# Patient Record
Sex: Female | Born: 1985 | Hispanic: No | Marital: Married | Smoking: Never smoker
Health system: Southern US, Community
[De-identification: ages and names within clinical notes are randomized; demographics above are authoritative.]

## PROBLEM LIST (undated history)

## (undated) DIAGNOSIS — Z789 Other specified health status: Secondary | ICD-10-CM

## (undated) HISTORY — PX: NO PAST SURGERIES: SHX2092

---

## 2008-08-03 ENCOUNTER — Ambulatory Visit (HOSPITAL_COMMUNITY): Admission: RE | Admit: 2008-08-03 | Discharge: 2008-08-03 | Payer: Self-pay | Admitting: Obstetrics & Gynecology

## 2008-08-12 ENCOUNTER — Ambulatory Visit (HOSPITAL_COMMUNITY): Admission: RE | Admit: 2008-08-12 | Discharge: 2008-08-12 | Payer: Self-pay | Admitting: Obstetrics & Gynecology

## 2008-09-09 ENCOUNTER — Ambulatory Visit (HOSPITAL_COMMUNITY): Admission: RE | Admit: 2008-09-09 | Discharge: 2008-09-09 | Payer: Self-pay | Admitting: Family Medicine

## 2008-12-31 ENCOUNTER — Inpatient Hospital Stay (HOSPITAL_COMMUNITY): Admission: AD | Admit: 2008-12-31 | Discharge: 2008-12-31 | Payer: Self-pay | Admitting: Obstetrics and Gynecology

## 2008-12-31 ENCOUNTER — Ambulatory Visit: Payer: Self-pay | Admitting: Obstetrics and Gynecology

## 2009-01-02 ENCOUNTER — Ambulatory Visit: Payer: Self-pay | Admitting: Obstetrics & Gynecology

## 2009-01-02 ENCOUNTER — Inpatient Hospital Stay (HOSPITAL_COMMUNITY): Admission: AD | Admit: 2009-01-02 | Discharge: 2009-01-04 | Payer: Self-pay | Admitting: Obstetrics & Gynecology

## 2010-04-16 LAB — CBC
HCT: 34.2 % — ABNORMAL LOW (ref 36.0–46.0)
Hemoglobin: 8.6 g/dL — ABNORMAL LOW (ref 12.0–15.0)
MCHC: 31.8 g/dL (ref 30.0–36.0)
MCHC: 32.4 g/dL (ref 30.0–36.0)
MCHC: 32.4 g/dL (ref 30.0–36.0)
MCV: 72.2 fL — ABNORMAL LOW (ref 78.0–100.0)
MCV: 72.3 fL — ABNORMAL LOW (ref 78.0–100.0)
MCV: 72.5 fL — ABNORMAL LOW (ref 78.0–100.0)
Platelets: 145 10*3/uL — ABNORMAL LOW (ref 150–400)
Platelets: 184 10*3/uL (ref 150–400)
RBC: 3.66 MIL/uL — ABNORMAL LOW (ref 3.87–5.11)
RDW: 15 % (ref 11.5–15.5)
RDW: 15.2 % (ref 11.5–15.5)

## 2010-04-16 LAB — CROSSMATCH: ABO/RH(D): B POS

## 2010-04-16 LAB — HEMOGLOBIN AND HEMATOCRIT, BLOOD: HCT: 23.7 % — ABNORMAL LOW (ref 36.0–46.0)

## 2010-04-22 LAB — HEMOGLOBINOPATHY EVALUATION
Hemoglobin Other: 0 % (ref 0.0–0.0)
Hgb A: 97.5 % (ref 96.8–97.8)
Hgb S Quant: 0 % (ref 0.0–0.0)

## 2010-07-08 IMAGING — US US OB FOLLOW-UP
1 series · 14 of 28 positions shown · non-contrast
Comparison: none

OBSTETRICAL ULTRASOUND:
 This ultrasound was performed in The [HOSPITAL], and the [facility] report will be stored to [REDACTED] PACS.

[Series 1: us ob follow-up · 52 acquisitions, 14 frames shown]
[im 2/52]
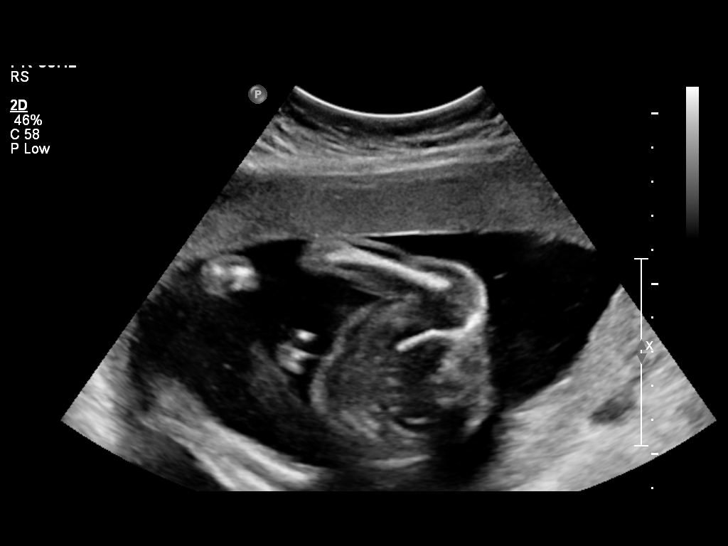
[im 6/52]
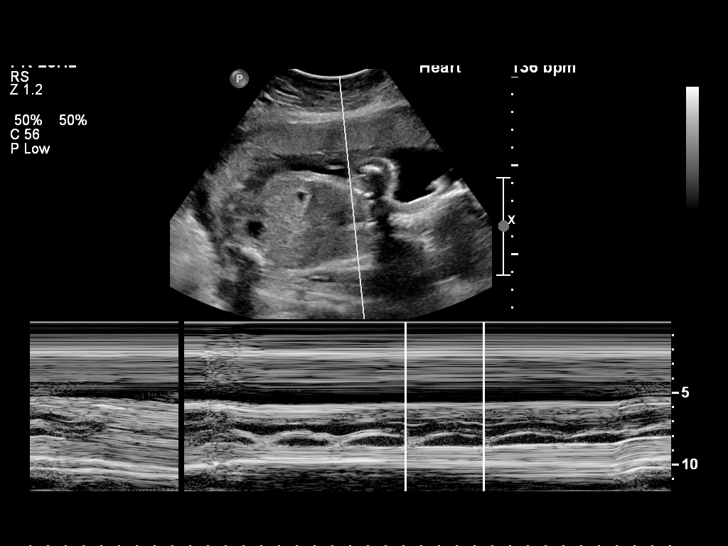
[im 10/52]
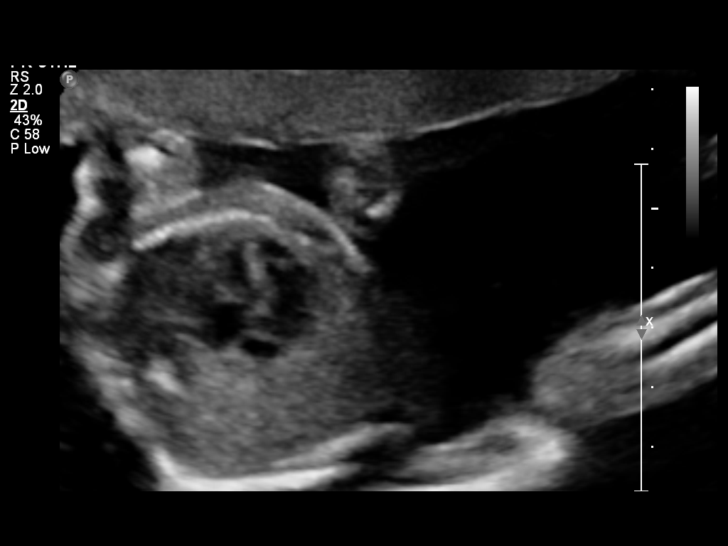
[im 14/52]
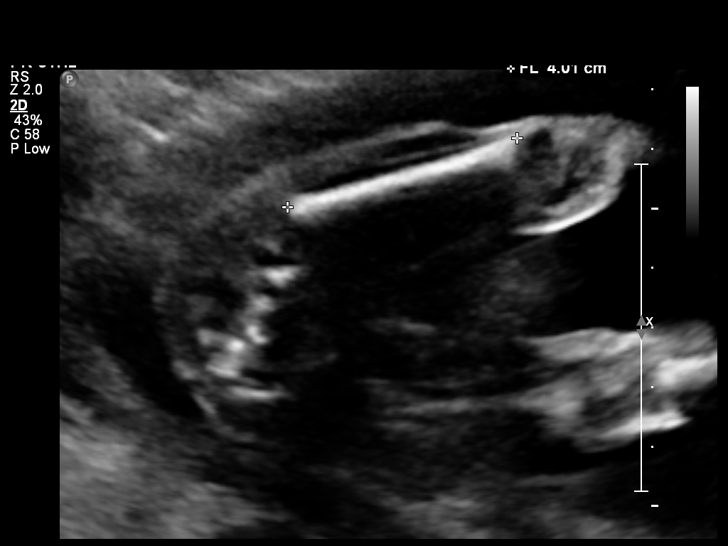
[im 18/52]
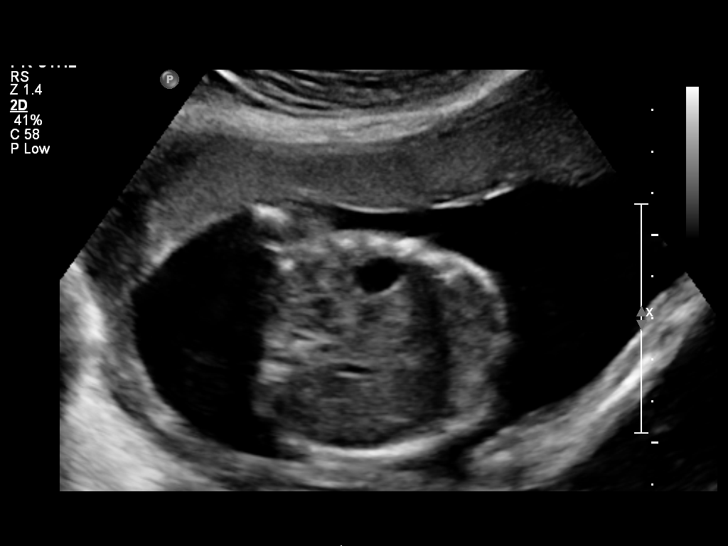
[im 21/52]
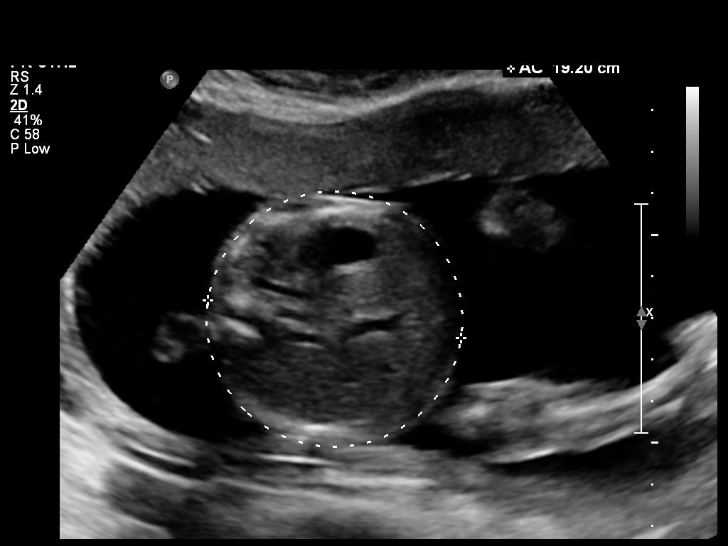
[im 25/52]
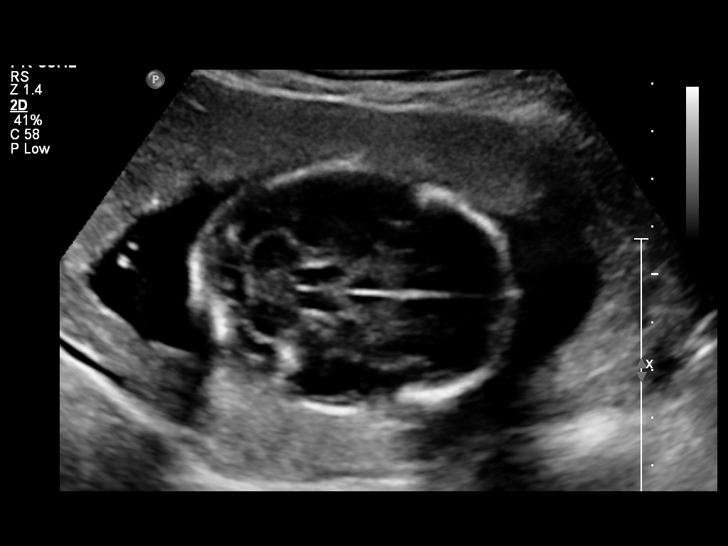
[im 29/52]
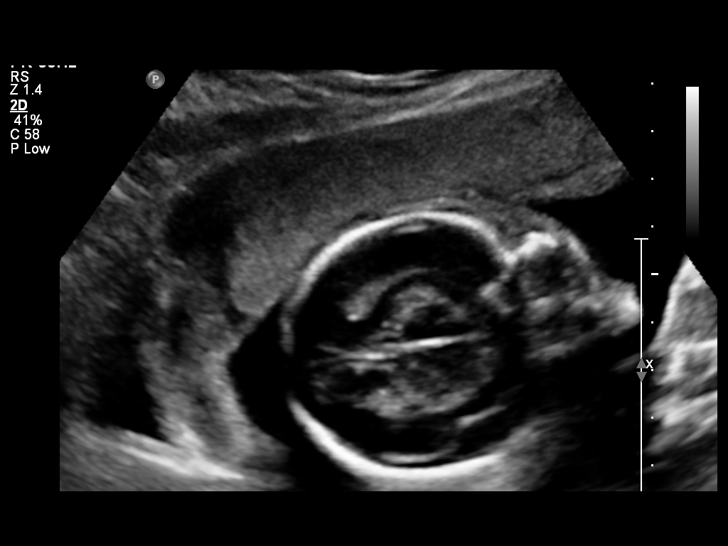
[im 33/52]
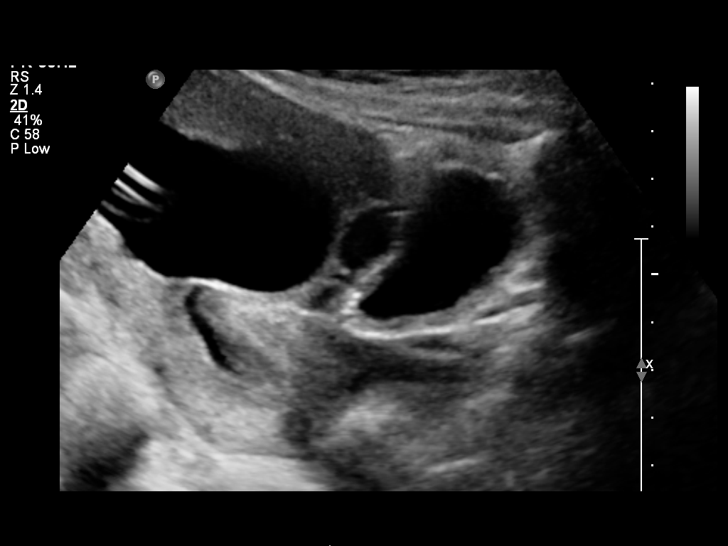
[im 36/52]
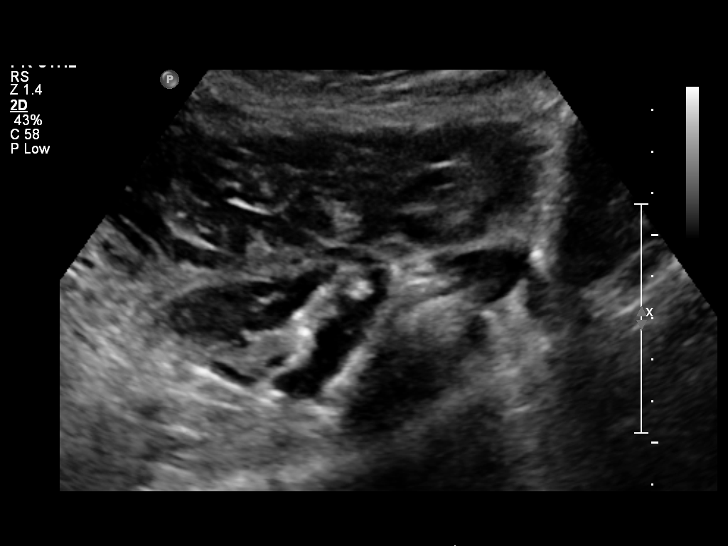
[im 40/52]
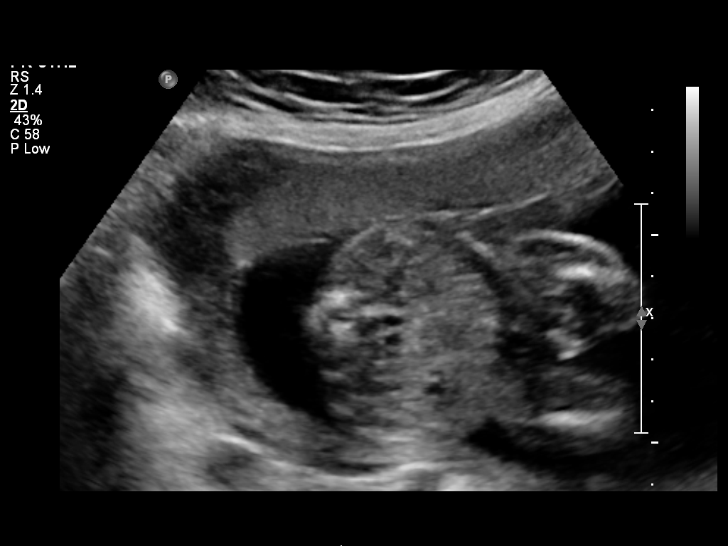
[im 44/52]
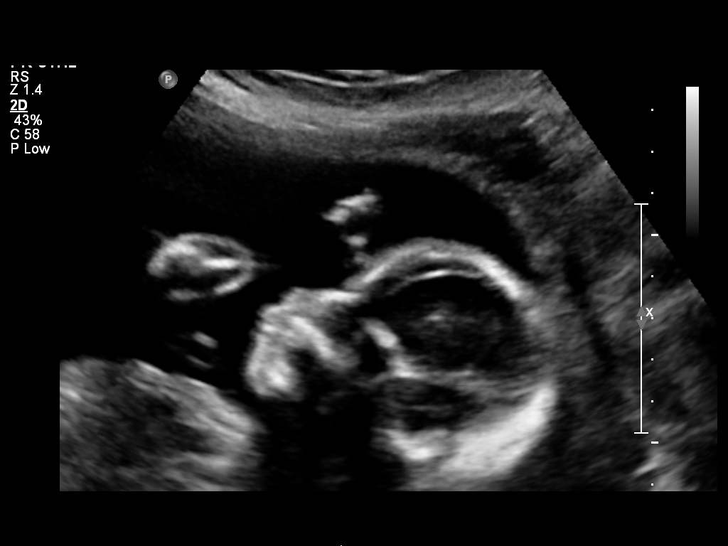
[im 48/52]
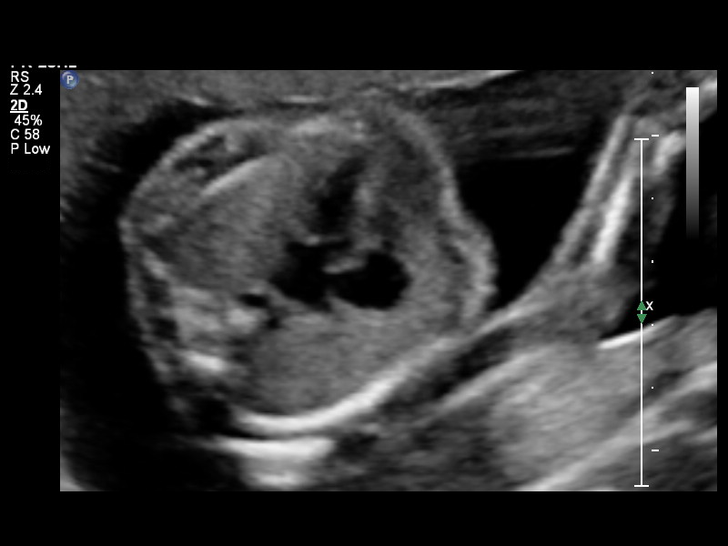
[im 52/52]
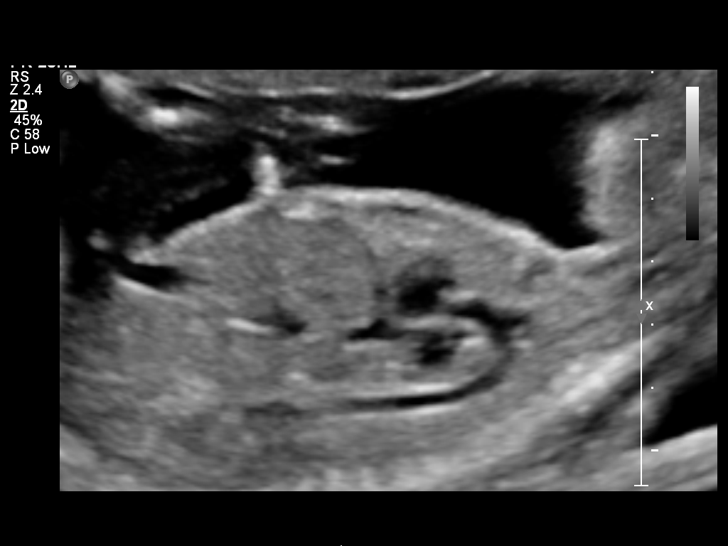

[14 of 28 positions shown; findings below may reference images not displayed]

IMPRESSION: [facility] has also been faxed to the ordering physician.

## 2010-08-07 ENCOUNTER — Other Ambulatory Visit: Payer: Self-pay | Admitting: Family Medicine

## 2010-08-07 DIAGNOSIS — Z3689 Encounter for other specified antenatal screening: Secondary | ICD-10-CM

## 2010-08-07 LAB — HIV ANTIBODY (ROUTINE TESTING W REFLEX): HIV: NONREACTIVE

## 2010-08-07 LAB — GC/CHLAMYDIA PROBE AMP, GENITAL: Gonorrhea: NEGATIVE

## 2010-08-07 LAB — RPR: RPR: NONREACTIVE

## 2010-09-18 ENCOUNTER — Ambulatory Visit (HOSPITAL_COMMUNITY)
Admission: RE | Admit: 2010-09-18 | Discharge: 2010-09-18 | Disposition: A | Discharge status: Home / Self Care | Payer: Medicaid Other | Source: Ambulatory Visit | Attending: Family Medicine | Admitting: Family Medicine

## 2010-09-18 DIAGNOSIS — Z3689 Encounter for other specified antenatal screening: Secondary | ICD-10-CM

## 2010-09-18 DIAGNOSIS — Z1389 Encounter for screening for other disorder: Secondary | ICD-10-CM | POA: Insufficient documentation

## 2010-09-18 DIAGNOSIS — Z363 Encounter for antenatal screening for malformations: Secondary | ICD-10-CM | POA: Insufficient documentation

## 2010-09-18 DIAGNOSIS — O358XX0 Maternal care for other (suspected) fetal abnormality and damage, not applicable or unspecified: Secondary | ICD-10-CM | POA: Insufficient documentation

## 2010-10-17 ENCOUNTER — Encounter (HOSPITAL_COMMUNITY): Payer: Self-pay | Admitting: *Deleted

## 2010-10-17 ENCOUNTER — Other Ambulatory Visit: Payer: Self-pay

## 2010-10-17 ENCOUNTER — Inpatient Hospital Stay (HOSPITAL_COMMUNITY)
Admission: AD | Admit: 2010-10-17 | Discharge: 2010-10-17 | Disposition: A | Discharge status: Home / Self Care | Payer: Medicaid Other | Source: Ambulatory Visit | Attending: Obstetrics & Gynecology | Admitting: Obstetrics & Gynecology

## 2010-10-17 DIAGNOSIS — R51 Headache: Secondary | ICD-10-CM | POA: Insufficient documentation

## 2010-10-17 HISTORY — DX: Other specified health status: Z78.9

## 2010-10-17 NOTE — Progress Notes (Signed)
Pt states, " I have had dizziness for 2 wks, but worse for 2 days."

## 2010-10-17 NOTE — Progress Notes (Signed)
Pt in c/o dizziness x2 weeks, worse x2 days.  Reports fatigue.  Denies any bleeding, discharge, or leaking of fluid.  + FM.  Denies any pain.

## 2010-10-17 NOTE — ED Provider Notes (Signed)
Patient had outpatient EKG for eval for dizziness.  EKG: regular rate of 88, sinus rhythm with narrow qrs complexes with no ST changes.  No arrhythmias present.  Patient informed of results.  Questions answered.   Candelaria Celeste JEHIEL 10/17/2010 1:29 PM

## 2011-01-15 NOTE — L&D Delivery Note (Cosign Needed)
Delivery Note At 3:47 AM a viable and healthy female was delivered via Vaginal, Spontaneous Delivery (Presentation: Right Occiput Anterior).  APGAR: 9, 10; weight .   Placenta status: Intact, Spontaneous.  Cord: 3 vessels with the following complications: None.  Cord pH: not indicated  Anesthesia: None  Episiotomy: none Lacerations: 1st degree Suture Repair: 2.0 vicryl Est. Blood Loss (mL):   Mom to postpartum.  Baby to nursery-stable.   D. Piloto Sherron Flemings Paz. MD PGY-1  02/18/2011, 4:08 AM

## 2011-02-14 ENCOUNTER — Other Ambulatory Visit (HOSPITAL_COMMUNITY): Payer: Self-pay | Admitting: Physician Assistant

## 2011-02-14 DIAGNOSIS — O480 Post-term pregnancy: Secondary | ICD-10-CM

## 2011-02-17 ENCOUNTER — Inpatient Hospital Stay (HOSPITAL_COMMUNITY)
Admission: AD | Admit: 2011-02-17 | Discharge: 2011-02-19 | DRG: 775 | Disposition: A | Discharge status: Home / Self Care | Payer: Medicaid Other | Source: Ambulatory Visit | Attending: Obstetrics & Gynecology | Admitting: Obstetrics & Gynecology

## 2011-02-17 DIAGNOSIS — Z22330 Carrier of Group B streptococcus: Secondary | ICD-10-CM

## 2011-02-17 DIAGNOSIS — IMO0002 Reserved for concepts with insufficient information to code with codable children: Secondary | ICD-10-CM

## 2011-02-17 DIAGNOSIS — O99892 Other specified diseases and conditions complicating childbirth: Secondary | ICD-10-CM | POA: Diagnosis present

## 2011-02-17 NOTE — Progress Notes (Signed)
Pt states her water broke in the [place]-was here for contractions

## 2011-02-18 ENCOUNTER — Encounter (HOSPITAL_COMMUNITY): Payer: Self-pay | Admitting: *Deleted

## 2011-02-18 ENCOUNTER — Ambulatory Visit (HOSPITAL_COMMUNITY): Admission: RE | Admit: 2011-02-18 | Payer: Medicaid Other | Source: Ambulatory Visit

## 2011-02-18 DIAGNOSIS — Z22330 Carrier of Group B streptococcus: Secondary | ICD-10-CM

## 2011-02-18 DIAGNOSIS — O9989 Other specified diseases and conditions complicating pregnancy, childbirth and the puerperium: Secondary | ICD-10-CM

## 2011-02-18 LAB — CBC
HCT: 34.3 % — ABNORMAL LOW (ref 36.0–46.0)
Platelets: 151 10*3/uL (ref 150–400)
RDW: 14.8 % (ref 11.5–15.5)
WBC: 11.1 10*3/uL — ABNORMAL HIGH (ref 4.0–10.5)

## 2011-02-18 MED ORDER — ZOLPIDEM TARTRATE 5 MG PO TABS: 5.0000 mg | ORAL_TABLET | Freq: Every evening | ORAL | Status: DC | PRN

## 2011-02-18 MED ORDER — IBUPROFEN 600 MG PO TABS
600.0000 mg | ORAL_TABLET | Freq: Four times a day (QID) | ORAL | Status: DC | PRN
  Administered 2011-02-18: 600 mg via ORAL
  Filled 2011-02-18: qty 1

## 2011-02-18 MED ORDER — OXYTOCIN 20 UNITS IN LACTATED RINGERS INFUSION - SIMPLE: 125.0000 mL/h | Freq: Once | INTRAVENOUS | Status: DC

## 2011-02-18 MED ORDER — BENZOCAINE-MENTHOL 20-0.5 % EX AERO
INHALATION_SPRAY | CUTANEOUS | Status: AC
  Administered 2011-02-18: 1 via TOPICAL
  Filled 2011-02-18: qty 56

## 2011-02-18 MED ORDER — TETANUS-DIPHTH-ACELL PERTUSSIS 5-2.5-18.5 LF-MCG/0.5 IM SUSP
0.5000 mL | Freq: Once | INTRAMUSCULAR | Status: AC
  Administered 2011-02-19: 0.5 mL via INTRAMUSCULAR
  Filled 2011-02-18: qty 0.5

## 2011-02-18 MED ORDER — LIDOCAINE HCL (PF) 1 % IJ SOLN
30.0000 mL | INTRAMUSCULAR | Status: DC | PRN
  Administered 2011-02-18: 30 mL via SUBCUTANEOUS
  Filled 2011-02-18: qty 30

## 2011-02-18 MED ORDER — SIMETHICONE 80 MG PO CHEW: 80.0000 mg | CHEWABLE_TABLET | ORAL | Status: DC | PRN

## 2011-02-18 MED ORDER — OXYCODONE-ACETAMINOPHEN 5-325 MG PO TABS
1.0000 | ORAL_TABLET | ORAL | Status: DC | PRN
  Administered 2011-02-18 (×3): 1 via ORAL
  Administered 2011-02-18: 2 via ORAL
  Administered 2011-02-19: 1 via ORAL
  Filled 2011-02-18 (×4): qty 1
  Filled 2011-02-18: qty 2

## 2011-02-18 MED ORDER — ONDANSETRON HCL 4 MG/2ML IJ SOLN: 4.0000 mg | Freq: Four times a day (QID) | INTRAMUSCULAR | Status: DC | PRN

## 2011-02-18 MED ORDER — IBUPROFEN 600 MG PO TABS
600.0000 mg | ORAL_TABLET | Freq: Four times a day (QID) | ORAL | Status: DC
  Administered 2011-02-18 – 2011-02-19 (×5): 600 mg via ORAL
  Filled 2011-02-18 (×5): qty 1

## 2011-02-18 MED ORDER — WITCH HAZEL-GLYCERIN EX PADS: 1.0000 "application " | MEDICATED_PAD | CUTANEOUS | Status: DC | PRN

## 2011-02-18 MED ORDER — ACETAMINOPHEN 325 MG PO TABS: 650.0000 mg | ORAL_TABLET | ORAL | Status: DC | PRN

## 2011-02-18 MED ORDER — DIPHENHYDRAMINE HCL 25 MG PO CAPS: 25.0000 mg | ORAL_CAPSULE | Freq: Four times a day (QID) | ORAL | Status: DC | PRN

## 2011-02-18 MED ORDER — OXYCODONE-ACETAMINOPHEN 5-325 MG PO TABS: 1.0000 | ORAL_TABLET | ORAL | Status: DC | PRN

## 2011-02-18 MED ORDER — LANOLIN HYDROUS EX OINT: TOPICAL_OINTMENT | CUTANEOUS | Status: DC | PRN

## 2011-02-18 MED ORDER — SODIUM CHLORIDE 0.9 % IV SOLN
2.0000 g | Freq: Once | INTRAVENOUS | Status: DC
  Filled 2011-02-18: qty 2000

## 2011-02-18 MED ORDER — PRENATAL MULTIVITAMIN CH
1.0000 | ORAL_TABLET | Freq: Every day | ORAL | Status: DC
  Administered 2011-02-18: 1 via ORAL
  Filled 2011-02-18: qty 1

## 2011-02-18 MED ORDER — LACTATED RINGERS IV SOLN: INTRAVENOUS | Status: DC

## 2011-02-18 MED ORDER — DIBUCAINE 1 % RE OINT: 1.0000 "application " | TOPICAL_OINTMENT | RECTAL | Status: DC | PRN

## 2011-02-18 MED ORDER — NALBUPHINE SYRINGE 5 MG/0.5 ML
10.0000 mg | INJECTION | INTRAMUSCULAR | Status: DC | PRN
  Administered 2011-02-18: 10 mg via INTRAVENOUS
  Filled 2011-02-18: qty 0.5
  Filled 2011-02-18: qty 1
  Filled 2011-02-18: qty 0.5

## 2011-02-18 MED ORDER — OXYTOCIN BOLUS FROM INFUSION
500.0000 mL | Freq: Once | INTRAVENOUS | Status: DC
  Filled 2011-02-18: qty 1000
  Filled 2011-02-18: qty 500

## 2011-02-18 MED ORDER — ONDANSETRON HCL 4 MG/2ML IJ SOLN: 4.0000 mg | INTRAMUSCULAR | Status: DC | PRN

## 2011-02-18 MED ORDER — LACTATED RINGERS IV SOLN: 500.0000 mL | INTRAVENOUS | Status: DC | PRN

## 2011-02-18 MED ORDER — CITRIC ACID-SODIUM CITRATE 334-500 MG/5ML PO SOLN: 30.0000 mL | ORAL | Status: DC | PRN

## 2011-02-18 MED ORDER — ONDANSETRON HCL 4 MG PO TABS: 4.0000 mg | ORAL_TABLET | ORAL | Status: DC | PRN

## 2011-02-18 MED ORDER — FLEET ENEMA 7-19 GM/118ML RE ENEM: 1.0000 | ENEMA | RECTAL | Status: DC | PRN

## 2011-02-18 MED ORDER — BENZOCAINE-MENTHOL 20-0.5 % EX AERO
1.0000 "application " | INHALATION_SPRAY | CUTANEOUS | Status: DC | PRN
  Administered 2011-02-18: 1 via TOPICAL

## 2011-02-18 MED ORDER — SENNOSIDES-DOCUSATE SODIUM 8.6-50 MG PO TABS
2.0000 | ORAL_TABLET | Freq: Every day | ORAL | Status: DC
  Administered 2011-02-18: 2 via ORAL

## 2011-02-18 NOTE — Progress Notes (Signed)
UR chart review completed.  

## 2011-02-18 NOTE — H&P (Signed)
Jessica Zamora is a 25 y.o. female G2P1001 with 65.5 w that comes to [facility] presenting whoosh of fluid at 11:00 PM of 02/17/11 and painful contractions. No vaginal bleeding. No cephalea, epigastric pain, change in vision nor edema. Pt speaks arabic an husband  Has been her interpreter. She had [facility] care at [facility] and during this pregnancy has had diagnosis of consanguinity (with genetic counseling offered and negative quad screen) and positive GBS. Plans for contraception: condoms  Maternal Medical History:  Reason for admission: Reason for Admission:   nausea  OB History    Grav Para Term Preterm Abortions TAB SAB Ect Mult Living   2 1 1  0 0 0 0 0 0 1     Past Medical History  Diagnosis Date  . No pertinent past medical history    Past Surgical History  Procedure Date  . No past surgeries    Family History: family history is not on file. Social History:  reports that she has never smoked. She does not have any smokeless tobacco history on file. She reports that she does not drink alcohol or use illicit drugs.  Review of Systems  Constitutional: Negative for fever and chills.  Eyes: Negative for blurred vision.  Respiratory: Negative for cough.   Cardiovascular: Negative for chest pain.  Gastrointestinal: Positive for abdominal pain. Negative for nausea and vomiting.  Genitourinary: Negative.   Musculoskeletal: Negative.   Skin: Negative for rash.  Neurological: Negative.  Negative for headaches.      Blood pressure 131/73, pulse 84, temperature 98.2 F (36.8 C), temperature source Oral, resp. rate 20, height 5\' 5"  (1.651 m), SpO2 99.00%. Exam Physical Exam  Constitutional: She is oriented to person, place, and time. She appears distressed.       Due to painful contractions.  HENT:  Mouth/Throat: Oropharynx is clear and moist.  Eyes: Conjunctivae and EOM are normal.  Neck: Neck supple.  Cardiovascular: Normal rate, regular rhythm and normal heart sounds.   No murmur  heard. Respiratory: Effort normal.  GI: Bowel sounds are normal.       Normal abdominal exam of 40 w gravid pt  Genitourinary:       Thick meconium tinted amniotic fluid per vagina. Cervix 4-5cm / 80%/ -3  Musculoskeletal: She exhibits no edema.  Neurological: She is alert and oriented to person, place, and time. She has normal reflexes.  Skin: Skin is warm and dry.    Prenatal labs: ABO, Rh:  B positive Antibody:  negative Rubella:  and varicella immune RPR:   neg HBsAg:   neg HIV:   neg GBS:   positive 1h GTT 83  Assessment: 26 y/o G2P1001 with 40.5 w admitted for SROM meconium tinted and active phase of labor. 2. Fetal Wellbeing: Category 1  3. Pain Control: declines epidural wants IV pain medication 4. GBS positive  Plan:  1. Admit to [facility] 2. Routine L&D orders 3. Ampicillin per GBS protocol. 3. Analgesia/anesthesia PRN.    D. Piloto Sherron Flemings Paz. MD PGY-1 02/18/2011, 12:06 AM

## 2011-02-19 MED ORDER — IBUPROFEN 600 MG PO TABS: 600.0000 mg | ORAL_TABLET | Freq: Four times a day (QID) | ORAL | Status: AC | PRN

## 2011-02-19 MED ORDER — PRENATAL MULTIVITAMIN CH: 1.0000 | ORAL_TABLET | Freq: Every day | ORAL | Status: DC

## 2011-02-19 NOTE — Progress Notes (Signed)
Post Partum Day 1 Subjective: no complaints, up ad lib, voiding, tolerating PO and + flatus  Objective: Blood pressure 114/70, pulse 69, temperature 98.1 F (36.7 C), temperature source Oral, resp. rate 18, height 5\' 5"  (1.651 m), weight 83 kg (182 lb 15.7 oz), SpO2 100.00%, unknown if currently breastfeeding.  Physical Exam:  General: alert, cooperative and no distress Lochia: appropriate Uterine Fundus: firm DVT Evaluation: No evidence of DVT seen on physical exam. Negative Homan's sign.   Basename 02/17/11 2345  HGB 11.0*  HCT 34.3*    Assessment/Plan: Discharge home, Breastfeeding and Contraception Condom   LOS: 2 days   D. Piloto [employer]. MD PGY-1 02/19/2011, 7:57 AM

## 2011-02-19 NOTE — Discharge Summary (Addendum)
Obstetric Discharge Summary Reason for Admission: onset of labor and rupture of membranes Prenatal Procedures: ultrasound Intrapartum Procedures: spontaneous vaginal delivery and GBS prophylaxis Postpartum Procedures: none Complications-Operative and Postpartum: 1st degree perineal laceration Hemoglobin  Date Value Range Status  02/17/2011 11.0* 12.0-15.0 (g/dL) Final     HCT  Date Value Range Status  02/17/2011 34.3* 36.0-46.0 (%) Final    Discharge Diagnoses: Term Pregnancy-delivered  Discharge Information: Date: 02/19/2011 Activity: unrestricted Diet: routine Medications: Ibuprofen Condition: stable Instructions: refer to practice specific booklet Discharge to: home Follow-up Information    Follow up with [facility]. (make an appointment in 6 weeks)    Contact information:   [address] [place] [address] [phone]         Newborn Data: Live born female  Birth Weight: 8 lb 6.6 oz (3815 g) APGAR: 9, 10  Home with mother.  D. Piloto Jessica Zamora. MD PGY-1 02/19/2011, 11:16 AM

## 2011-02-19 NOTE — Discharge Summary (Signed)
Attestation of Attending Supervision of Resident: Evaluation and management procedures were performed by the [facility] Medicine Resident under my supervision.  I have reviewed the resident's note, chart reviewed and agree with management and plan.  Jaynie Collins, M.D. 02/19/2011 11:29 AM

## 2011-02-19 NOTE — Discharge Summary (Signed)
Attestation of Attending Supervision of Resident: Evaluation and management procedures were performed by the [facility] Medicine Resident under my supervision.  I have reviewed the resident's note, chart reviewed and agree with management and plan.  Jaynie Collins, M.D. 02/19/2011 11:25 AM

## 2011-02-20 NOTE — H&P (Signed)
Agree with above note.  Jessica Zamora H. 02/20/2011 12:09 PM  

## 2011-02-22 ENCOUNTER — Encounter (HOSPITAL_COMMUNITY): Payer: Self-pay | Admitting: *Deleted

## 2011-02-22 ENCOUNTER — Inpatient Hospital Stay (HOSPITAL_COMMUNITY)
Admission: AD | Admit: 2011-02-22 | Discharge: 2011-02-22 | Disposition: A | Discharge status: Home / Self Care | Payer: Medicaid Other | Source: Ambulatory Visit | Attending: Obstetrics and Gynecology | Admitting: Obstetrics and Gynecology

## 2011-02-22 DIAGNOSIS — W010XXA Fall on same level from slipping, tripping and stumbling without subsequent striking against object, initial encounter: Secondary | ICD-10-CM | POA: Insufficient documentation

## 2011-02-22 DIAGNOSIS — IMO0002 Reserved for concepts with insufficient information to code with codable children: Secondary | ICD-10-CM | POA: Insufficient documentation

## 2011-02-22 MED ORDER — OXYCODONE-ACETAMINOPHEN 5-325 MG PO TABS: 1.0000 | ORAL_TABLET | ORAL | Status: AC | PRN

## 2011-02-22 MED ORDER — CYCLOBENZAPRINE HCL 5 MG PO TABS: 5.0000 mg | ORAL_TABLET | Freq: Three times a day (TID) | ORAL | Status: AC | PRN

## 2011-02-22 NOTE — Progress Notes (Addendum)
Fell in the bathroom today.  Did not pass out just slipped. Floor was wet, landed on bottom.  Delivered 02/04- vaginal.  Uncomfortable to sit.( pt leaning to side, off bottom).  Pt is breast feeding and baby is doing well.

## 2011-02-22 NOTE — ED Provider Notes (Signed)
History 26 yo G2P2002 at 4 days post uncomplicated NSVD presents with constant sacral pain onset after she slipped on wet shower tile landing on buttocks at 1030 today.  Walking normally but sitting and moving exacerbate the pain. No radicular pain or paresthesia. Also have pain from stitches (1st degree lac repair) making sitting uncomfortable.  Tried ibuprofen without improvement. Breastfeeding.   Filed Vitals:   02/22/11 1343  BP: 129/77  Pulse: 81  Temp:   Resp: 20     Physical exam Appears anxious and uncomfortable, moving gingerly in bed. Teary when I suggested a digital exam to feel her coccyx (not done) Perineum: normal lochia, midline perineal sutures intact.  Back: No bruising, erythema or skin disruption of back or butttock; tender to palpation over sacrum directly and paraspinous muscles.  Assessment 4 days PP Low back muscle strain due to trauma  Plan D/W Dr. Jolayne Panther. Manage with analgesics, local heat or ice rubs to affected area, rest. Rx Flexeril, Percocet. Return if worse or no improvement with treatment.

## 2011-02-23 ENCOUNTER — Inpatient Hospital Stay (HOSPITAL_COMMUNITY): Admission: RE | Admit: 2011-02-23 | Payer: Medicaid Other | Source: Ambulatory Visit

## 2011-02-23 NOTE — ED Provider Notes (Signed)
Agree with above note.  Jessica Zamora 02/23/2011 6:03 AM

## 2013-11-15 ENCOUNTER — Encounter (HOSPITAL_COMMUNITY): Payer: Self-pay | Admitting: *Deleted

## 2014-02-03 ENCOUNTER — Ambulatory Visit: Payer: Medicaid Other

## 2014-03-26 ENCOUNTER — Encounter (HOSPITAL_COMMUNITY): Payer: Self-pay | Admitting: Emergency Medicine

## 2014-03-26 ENCOUNTER — Emergency Department (HOSPITAL_COMMUNITY)
Admission: EM | Admit: 2014-03-26 | Discharge: 2014-03-26 | Disposition: A | Discharge status: Home / Self Care | Payer: Medicaid Other | Attending: Emergency Medicine | Admitting: Emergency Medicine

## 2014-03-26 ENCOUNTER — Emergency Department (HOSPITAL_COMMUNITY): Payer: Medicaid Other

## 2014-03-26 DIAGNOSIS — R599 Enlarged lymph nodes, unspecified: Secondary | ICD-10-CM | POA: Insufficient documentation

## 2014-03-26 DIAGNOSIS — R112 Nausea with vomiting, unspecified: Secondary | ICD-10-CM

## 2014-03-26 DIAGNOSIS — R1013 Epigastric pain: Secondary | ICD-10-CM | POA: Insufficient documentation

## 2014-03-26 DIAGNOSIS — R079 Chest pain, unspecified: Secondary | ICD-10-CM | POA: Insufficient documentation

## 2014-03-26 DIAGNOSIS — Z3202 Encounter for pregnancy test, result negative: Secondary | ICD-10-CM | POA: Insufficient documentation

## 2014-03-26 LAB — COMPREHENSIVE METABOLIC PANEL
ALBUMIN: 4 g/dL (ref 3.5–5.2)
ALT: 15 U/L (ref 0–35)
ANION GAP: 6 (ref 5–15)
AST: 20 U/L (ref 0–37)
Alkaline Phosphatase: 52 U/L (ref 39–117)
BUN: 6 mg/dL (ref 6–23)
CHLORIDE: 106 mmol/L (ref 96–112)
CO2: 25 mmol/L (ref 19–32)
Calcium: 9.2 mg/dL (ref 8.4–10.5)
Creatinine, Ser: 0.66 mg/dL (ref 0.50–1.10)
GFR calc Af Amer: >90 mL/min (ref >90)
Glucose, Bld: 108 mg/dL — ABNORMAL HIGH (ref 70–99)
Potassium: 4.2 mmol/L (ref 3.5–5.1)
Sodium: 137 mmol/L (ref 135–145)
TOTAL PROTEIN: 7 g/dL (ref 6.0–8.3)
Total Bilirubin: 0.6 mg/dL (ref 0.3–1.2)

## 2014-03-26 LAB — CBC WITH DIFFERENTIAL/PLATELET
BASOS PCT: 0 % (ref 0–1)
Basophils Absolute: 0 10*3/uL (ref 0.0–0.1)
Eosinophils Absolute: 0.2 10*3/uL (ref 0.0–0.7)
Eosinophils Relative: 2 % (ref 0–5)
HEMATOCRIT: 37.5 % (ref 36.0–46.0)
Hemoglobin: 12 g/dL (ref 12.0–15.0)
LYMPHS ABS: 0.7 10*3/uL (ref 0.7–4.0)
LYMPHS PCT: 6 % — AB (ref 12–46)
MCH: 22.1 pg — ABNORMAL LOW (ref 26.0–34.0)
MCHC: 32 g/dL (ref 30.0–36.0)
MCV: 69.2 fL — ABNORMAL LOW (ref 78.0–100.0)
MONO ABS: 0.7 10*3/uL (ref 0.1–1.0)
MONOS PCT: 6 % (ref 3–12)
Neutro Abs: 10.1 10*3/uL — ABNORMAL HIGH (ref 1.7–7.7)
Neutrophils Relative %: 86 % — ABNORMAL HIGH (ref 43–77)
Platelets: 176 10*3/uL (ref 150–400)
RBC: 5.42 MIL/uL — AB (ref 3.87–5.11)
RDW: 14.6 % (ref 11.5–15.5)
SMEAR REVIEW: ADEQUATE
WBC: 11.7 10*3/uL — ABNORMAL HIGH (ref 4.0–10.5)

## 2014-03-26 LAB — URINALYSIS, ROUTINE W REFLEX MICROSCOPIC
BILIRUBIN URINE: NEGATIVE
GLUCOSE, UA: NEGATIVE mg/dL
Hgb urine dipstick: NEGATIVE
Ketones, ur: NEGATIVE mg/dL
Leukocytes, UA: NEGATIVE
NITRITE: NEGATIVE
Protein, ur: NEGATIVE mg/dL
Specific Gravity, Urine: 1.025 (ref 1.005–1.030)
UROBILINOGEN UA: 1 mg/dL (ref 0.0–1.0)
pH: 8 (ref 5.0–8.0)

## 2014-03-26 LAB — I-STAT TROPONIN, ED: TROPONIN I, POC: 0 ng/mL (ref 0.00–0.08)

## 2014-03-26 LAB — LIPASE, BLOOD: LIPASE: 31 U/L (ref 11–59)

## 2014-03-26 LAB — POC URINE PREG, ED: Preg Test, Ur: NEGATIVE

## 2014-03-26 MED ORDER — RANITIDINE HCL 150 MG PO CAPS: 150.0000 mg | ORAL_CAPSULE | Freq: Every day | ORAL | Status: DC

## 2014-03-26 MED ORDER — ONDANSETRON HCL 4 MG PO TABS
4.0000 mg | ORAL_TABLET | Freq: Once | ORAL | Status: AC
Start: 2014-03-26 — End: 2014-03-26
  Administered 2014-03-26: 4 mg via ORAL
  Filled 2014-03-26: qty 1

## 2014-03-26 MED ORDER — ACETAMINOPHEN 325 MG PO TABS
650.0000 mg | ORAL_TABLET | Freq: Once | ORAL | Status: AC
  Administered 2014-03-26: 650 mg via ORAL
  Filled 2014-03-26: qty 2

## 2014-03-26 NOTE — ED Provider Notes (Signed)
29 year old female presents with epigastric discomfort, worse at night, cough which is worse at night, no exertional symptoms. On exam the patient states she has no pain, she has clear heart and lung sounds, occasional dry cough, no peripheral edema. EKG unremarkable, patient doubtful to have acute coronary syndrome or other severe or pathologic cause of her pain, more likely to be acid reflux, possibly related to upper respiratory infection, patient can be safely discharged home to follow-up in the outpatient setting.   EKG Interpretation  Date/Time:  Saturday March 26 2014 04:05:15 EST Ventricular Rate:  90 PR Interval:  146 QRS Duration: 76 QT Interval:  354 QTC Calculation: 433 R Axis:   54 Text Interpretation:  Normal sinus rhythm Normal ECG since last tracing no significant change Confirmed by Oni Dietzman  MD, Nikaela Coyne ([id number]) on 03/26/2014 7:12:05 AM       Medical screening examination/treatment/procedure(s) were conducted as a shared visit with non-physician practitioner(s) and myself.  I personally evaluated the patient during the encounter.  Clinical Impression:   Final diagnoses:  Epigastric abdominal pain  Nausea and vomiting, vomiting of unspecified type  Chest pain, unspecified chest pain type         Eber HongBrian Njeri Vicente, MD 03/26/14 1552

## 2014-03-26 NOTE — ED Notes (Signed)
PO Challenge started

## 2014-03-26 NOTE — Discharge Instructions (Signed)
Return to the emergency room with worsening of symptoms, new symptoms or with symptoms that are concerning , especially chest pain that feels like a pressure, spreads to left arm or jaw, worse with exertion, associated with nausea, vomiting, shortness of breath and/or sweating.  Start taking zantac daily. Follow up with wellness center in 2 days. They have walk in clinic. Drink plenty of fluids with electrolytes especially Gatorade. OTC cold medications such as mucinex, nyquil, dayquil are recommended. Chloraseptic for sore throat. Read below information and follow recommendations.  Chest Pain (Nonspecific) It is often hard to give a specific diagnosis for the cause of chest pain. There is always a chance that your pain could be related to something serious, such as a heart attack or a blood clot in the lungs. You need to follow up with your health care provider for further evaluation. CAUSES   Heartburn.  Pneumonia or bronchitis.  Anxiety or stress.  Inflammation around your heart (pericarditis) or lung (pleuritis or pleurisy).  A blood clot in the lung.  A collapsed lung (pneumothorax). It can develop suddenly on its own (spontaneous pneumothorax) or from trauma to the chest.  Shingles infection (herpes zoster virus). The chest wall is composed of bones, muscles, and cartilage. Any of these can be the source of the pain.  The bones can be bruised by injury.  The muscles or cartilage can be strained by coughing or overwork.  The cartilage can be affected by inflammation and become sore (costochondritis). DIAGNOSIS  Lab tests or other studies may be needed to find the cause of your pain. Your health care provider may have you take a test called an ambulatory electrocardiogram (ECG). An ECG records your heartbeat patterns over a 24-hour period. You may also have other tests, such as:  Transthoracic echocardiogram (TTE). During echocardiography, sound waves are used to evaluate how  blood flows through your heart.  Transesophageal echocardiogram (TEE).  Cardiac monitoring. This allows your health care provider to monitor your heart rate and rhythm in real time.  Holter monitor. This is a portable device that records your heartbeat and can help diagnose heart arrhythmias. It allows your health care provider to track your heart activity for several days, if needed.  Stress tests by exercise or by giving medicine that makes the heart beat faster. TREATMENT   Treatment depends on what may be causing your chest pain. Treatment may include:  Acid blockers for heartburn.  Anti-inflammatory medicine.  Pain medicine for inflammatory conditions.  Antibiotics if an infection is present.  You may be advised to change lifestyle habits. This includes stopping smoking and avoiding alcohol, caffeine, and chocolate.  You may be advised to keep your head raised (elevated) when sleeping. This reduces the chance of acid going backward from your stomach into your esophagus. Most of the time, nonspecific chest pain will improve within 2-3 days with rest and mild pain medicine.  HOME CARE INSTRUCTIONS   If antibiotics were prescribed, take them as directed. Finish them even if you start to feel better.  For the next few days, avoid physical activities that bring on chest pain. Continue physical activities as directed.  Do not use any tobacco products, including cigarettes, chewing tobacco, or electronic cigarettes.  Avoid drinking alcohol.  Only take medicine as directed by your health care provider.  Follow your health care provider's suggestions for further testing if your chest pain does not go away.  Keep any follow-up appointments you made. If you do not go to  an appointment, you could develop lasting (chronic) problems with pain. If there is any problem keeping an appointment, call to reschedule. SEEK MEDICAL CARE IF:   Your chest pain does not go away, even after  treatment.  You have a rash with blisters on your chest.  You have a fever. SEEK IMMEDIATE MEDICAL CARE IF:   You have increased chest pain or pain that spreads to your arm, neck, jaw, back, or abdomen.  You have shortness of breath.  You have an increasing cough, or you cough up blood.  You have severe back or abdominal pain.  You feel nauseous or vomit.  You have severe weakness.  You faint.  You have chills. This is an emergency. Do not wait to see if the pain will go away. Get medical help at once. Call your local emergency services (911 in [place] Do not drive yourself to the hospital. MAKE SURE YOU:   Understand these instructions.  Will watch your condition.  Will get help right away if you are not doing well or get worse. Document Released: 10/10/2004 Document Revised: 01/05/2013 Document Reviewed: 08/06/2007 [facility] Patient Information 2015 [place], [place]. This information is not intended to replace advice given to you by your health care provider. Make sure you discuss any questions you have with your health care provider.   Abdominal Pain Many things can cause abdominal pain. Usually, abdominal pain is not caused by a disease and will improve without treatment. It can often be observed and treated at home. Your health care provider will do a physical exam and possibly order blood tests and X-rays to help determine the seriousness of your pain. However, in many cases, more time must pass before a clear cause of the pain can be found. Before that point, your health care provider may not know if you need more testing or further treatment. HOME CARE INSTRUCTIONS  Monitor your abdominal pain for any changes. The following actions may help to alleviate any discomfort you are experiencing:  Only take over-the-counter or prescription medicines as directed by your health care provider.  Do not take laxatives unless directed to do so by your health care provider.  Try a  clear liquid diet (broth, tea, or water) as directed by your health care provider. Slowly move to a bland diet as tolerated. SEEK MEDICAL CARE IF:  You have unexplained abdominal pain.  You have abdominal pain associated with nausea or diarrhea.  You have pain when you urinate or have a bowel movement.  You experience abdominal pain that wakes you in the night.  You have abdominal pain that is worsened or improved by eating food.  You have abdominal pain that is worsened with eating fatty foods.  You have a fever. SEEK IMMEDIATE MEDICAL CARE IF:   Your pain does not go away within 2 hours.  You keep throwing up (vomiting).  Your pain is felt only in portions of the abdomen, such as the right side or the left lower portion of the abdomen.  You pass bloody or black tarry stools. MAKE SURE YOU:  Understand these instructions.   Will watch your condition.   Will get help right away if you are not doing well or get worse.  Document Released: 10/10/2004 Document Revised: 01/05/2013 Document Reviewed: 09/09/2012 [facility] Patient Information 2015 [place], [place]. This information is not intended to replace advice given to you by your health care provider. Make sure you discuss any questions you have with your health care provider.

## 2014-03-26 NOTE — ED Notes (Signed)
C/o chest pain, upper abd pain, nausea, and vomiting since 8pm.  Denies urinary complaint.

## 2014-03-26 NOTE — ED Provider Notes (Signed)
CSN: [id number]     Arrival date & time 03/26/14  0343 History   First MD Initiated Contact with Patient 03/26/14 0606     Chief Complaint  Patient presents with  . Abdominal Pain  . Chest Pain     (Consider location/radiation/quality/duration/timing/severity/associated sxs/prior Treatment) HPI  Jessica Zamora is a 29 y.o. female presenting with epigastric abdominal pain as well as chest pain that started last night around 8 PM. She also reports nausea and vomiting times one that is nonbloody nonbilious. No shortness of breath. No cardiac history. She denies any alleviating or aggravating factors and also reports some cough congestion and runny nose. She denies any changes in her stool no melanotic or hematochezia. She denies fevers or chills. Patient states she has had no pain at this time. She does report some nausea. Pt denies history of DVT, PE, recent surgery or trauma, malignancy, hemoptysis, exogenous estrogen use, unilateral leg swelling or tenderness, immobilization.   Past Medical History  Diagnosis Date  . No pertinent past medical history    Past Surgical History  Procedure Laterality Date  . No past surgeries     Family History  Problem Relation Age of Onset  . Anesthesia problems Neg Hx    History  Substance Use Topics  . Smoking status: Never Smoker   . Smokeless tobacco: Never Used  . Alcohol Use: No   OB History    Gravida Para Term Preterm AB TAB SAB Ectopic Multiple Living   0 0 0 0 0 0 2     Review of Systems 10 Systems reviewed and are negative for acute change except as noted in the HPI.    Allergies  Review of patient's allergies indicates no known allergies.  Home Medications   Prior to Admission medications   Medication Sig Start Date End Date Taking? Authorizing Provider  Multiple Vitamin (MULTIVITAMIN WITH MINERALS) TABS tablet Take 1 tablet by mouth daily.   Yes Historical Provider, MD  Prenatal Vit-Fe Fumarate-FA (PRENATAL  MULTIVITAMIN) TABS Take 1 tablet by mouth daily. Patient not taking: Reported on 03/26/2014 02/19/11   Dayarmys Piloto de Criselda Peaches, MD  ranitidine (ZANTAC) 150 MG capsule Take 1 capsule (150 mg total) by mouth daily. 03/26/14   Jessica Conroy, PA-C   BP 99/67 mmHg  Pulse 68  Temp(Src) 97.9 F (36.6 C) (Oral)  Resp 20  Wt 163 lb 2 oz (73.993 kg)  SpO2 100%  LMP 03/13/2014 Physical Exam  Constitutional: She appears well-developed and well-nourished. No distress.  HENT:  Head: Normocephalic and atraumatic.  Mouth/Throat: Mucous membranes are normal. Posterior oropharyngeal erythema present. No oropharyngeal exudate or posterior oropharyngeal edema.  Eyes: Conjunctivae and EOM are normal. Right eye exhibits no discharge. Left eye exhibits no discharge.  Neck: Normal range of motion. Neck supple. No JVD present.  Cardiovascular: Normal rate, regular rhythm and normal heart sounds.   No leg swelling or tenderness. Negative Homan's sign.  Pulmonary/Chest: Effort normal and breath sounds normal. No respiratory distress. She has no wheezes. She has no rales.  Abdominal: Soft. Bowel sounds are normal. She exhibits no distension. There is no tenderness.  Lymphadenopathy:    She has cervical adenopathy.  Neurological: She is alert.  Skin: Skin is warm and dry. She is not diaphoretic.  Nursing note and vitals reviewed.   ED Course  Procedures (including critical care time) Labs Review Labs Reviewed  CBC WITH DIFFERENTIAL/PLATELET - Abnormal; Notable for the following:    WBC 11.7 (*)  RBC 5.42 (*)    MCV 69.2 (*)    MCH 22.1 (*)    Neutrophils Relative % 86 (*)    Lymphocytes Relative 6 (*)    Neutro Abs 10.1 (*)    All other components within normal limits  COMPREHENSIVE METABOLIC PANEL - Abnormal; Notable for the following:    Glucose, Bld 108 (*)    All other components within normal limits  LIPASE, BLOOD  URINALYSIS, ROUTINE W REFLEX MICROSCOPIC  I-STAT TROPOININ, ED  POC URINE  PREG, ED    Imaging Review Dg Chest 2 View  03/26/2014   CLINICAL DATA:  Chest and abdominal pain. Emesis. Symptoms for 2 months after eating. Productive cough.  EXAM: CHEST  2 VIEW  COMPARISON:  None.  FINDINGS: The cardiomediastinal contours are normal. The lungs are clear. Pulmonary vasculature is normal. No consolidation, pleural effusion, or pneumothorax. No acute osseous abnormalities are seen.  IMPRESSION: No acute pulmonary process.   Electronically Signed   By: Rubye OaksMelanie  Ehinger M.D.   On: 03/26/2014 05:17     EKG Interpretation   Date/Time:  Saturday March 26 2014 04:05:15 EST Ventricular Rate:  90 PR Interval:  146 QRS Duration: 76 QT Interval:  354 QTC Calculation: 433 R Axis:   54 Text Interpretation:  Normal sinus rhythm Normal ECG since last tracing no  significant change Confirmed by MILLER  MD, BRIAN ([id number]) on 03/26/2014  7:12:05 AM      MDM   Final diagnoses:  Epigastric abdominal pain  Nausea and vomiting, vomiting of unspecified type  Chest pain, unspecified chest pain type   Patient presenting with 2 day history of constant chest pain and epigastric abdominal pain with nausea and vomiting. PERC negative with a risk heart score. Patient also with cough and runny nose. VSS. No tenderness on exam. No JVD or peripheral edema. Patient stating she is in no pain at this time. EKG without evidence of ischemia. Lab work noncontributory. Patient's chest pain likely related to viral syndrome versus GERD.  I doubt ACS, PE or aortic dissection. Patient's tolerating fluid in in the ED without difficulty. Patient nontoxic and stable for discharge and outpatient management. Patient without PCP and given referral to the wellness center and instructions to follow-up in 2-5 days.  Discussed return precautions with patient. Discussed all results and patient verbalizes understanding and agrees with plan.  This is a shared patient. This patient was discussed with the physician who  saw and evaluated the patient and agrees with the plan.   Jessica ConroyVictoria Maryiah Olvey, PA-C 03/26/14 1538  Eber HongBrian Miller, MD 03/26/14 [phone]

## 2015-03-09 ENCOUNTER — Encounter: Payer: Self-pay | Admitting: Family Medicine

## 2015-03-09 ENCOUNTER — Ambulatory Visit: Payer: Medicaid Other | Attending: Family Medicine | Admitting: Family Medicine

## 2015-03-09 VITALS — BP 101/65 | HR 75 | Temp 98.2°F | Resp 16 | Ht 63.75 in | Wt 176.0 lb

## 2015-03-09 DIAGNOSIS — D509 Iron deficiency anemia, unspecified: Secondary | ICD-10-CM | POA: Diagnosis not present

## 2015-03-09 DIAGNOSIS — L659 Nonscarring hair loss, unspecified: Secondary | ICD-10-CM | POA: Insufficient documentation

## 2015-03-09 DIAGNOSIS — Z0000 Encounter for general adult medical examination without abnormal findings: Secondary | ICD-10-CM | POA: Diagnosis not present

## 2015-03-09 DIAGNOSIS — Z0001 Encounter for general adult medical examination with abnormal findings: Secondary | ICD-10-CM | POA: Insufficient documentation

## 2015-03-09 DIAGNOSIS — Z114 Encounter for screening for human immunodeficiency virus [HIV]: Secondary | ICD-10-CM

## 2015-03-09 DIAGNOSIS — E559 Vitamin D deficiency, unspecified: Secondary | ICD-10-CM

## 2015-03-09 DIAGNOSIS — Z79899 Other long term (current) drug therapy: Secondary | ICD-10-CM | POA: Diagnosis not present

## 2015-03-09 LAB — CBC
HCT: 36.4 % (ref 36.0–46.0)
Hemoglobin: 11.6 g/dL — ABNORMAL LOW (ref 12.0–15.0)
MCH: 22.1 pg — ABNORMAL LOW (ref 26.0–34.0)
MCHC: 31.9 g/dL (ref 30.0–36.0)
MCV: 69.5 fL — AB (ref 78.0–100.0)
PLATELETS: 226 10*3/uL (ref 150–400)
RBC: 5.24 MIL/uL — ABNORMAL HIGH (ref 3.87–5.11)
RDW: 14.2 % (ref 11.5–15.5)
WBC: 9.3 10*3/uL (ref 4.0–10.5)

## 2015-03-09 LAB — IRON AND TIBC
%SAT: 18 % (ref 11–50)
IRON: 60 ug/dL (ref 40–190)
TIBC: 337 ug/dL (ref 250–450)
UIBC: 277 ug/dL (ref 125–400)

## 2015-03-09 LAB — TSH: TSH: 1.17 mIU/L

## 2015-03-09 LAB — FERRITIN: Ferritin: 25 ng/mL (ref 10–154)

## 2015-03-09 LAB — POCT GLYCOSYLATED HEMOGLOBIN (HGB A1C): HEMOGLOBIN A1C: 5.3

## 2015-03-09 LAB — HIV ANTIBODY (ROUTINE TESTING W REFLEX): HIV 1&2 Ab, 4th Generation: NONREACTIVE

## 2015-03-09 NOTE — Progress Notes (Signed)
Establish care  Hair loss, bold spot  No pain today  No tobacco user  No suicidal thoughts in the past two weeks

## 2015-03-09 NOTE — Patient Instructions (Addendum)
Jessica Zamora was seen today for hair/scalp problem and establish care.  Diagnoses and all orders for this visit:  Healthcare maintenance -     POCT glycosylated hemoglobin (Hb A1C)  Thinning hair -     TSH -     CBC -     Vitamin D, 25-hydroxy -     Iron and TIBC -     Ferritin  Screening for HIV (human immunodeficiency virus) -     HIV antibody (with reflex)   Take at least 1000 mcg of biotin or take the hair, skin and nail vitamin.   You will be called with lab results  F/u for pap smear in 4-6 weeks  Dr. Armen Pickup

## 2015-03-09 NOTE — Assessment & Plan Note (Signed)
A: thinning hair at temples P: Take at least 1000 mcg of biotin or take the hair, skin and nail vitamin.

## 2015-03-09 NOTE — Progress Notes (Signed)
   Subjective:  Patient ID: Jessica Zamora, female    DOB: [birth year]  Age: 30 y.o. MRN: [id number]  CC: Hair/Scalp Problem and Establish Care   HPI Devonia Farro presents for    1. hair loss: x 4 years at temples. Painless. R >L. Tried biotin and minoxidil without improvement. Distressed by hair loss. Hair loss started while breastfeeding.   Social History  Substance Use Topics  . Smoking status: Never Smoker   . Smokeless tobacco: Never Used  . Alcohol Use: No   Outpatient Prescriptions Prior to Visit  Medication Sig Dispense Refill  . Multiple Vitamin (MULTIVITAMIN WITH MINERALS) TABS tablet Take 1 tablet by mouth daily. Reported on 03/09/2015    . Prenatal Vit-Fe Fumarate-FA (PRENATAL MULTIVITAMIN) TABS Take 1 tablet by mouth daily. (Patient not taking: Reported on 03/26/2014) 60 tablet 0  . ranitidine (ZANTAC) 150 MG capsule Take 1 capsule (150 mg total) by mouth daily. (Patient not taking: Reported on 03/09/2015) 30 capsule 0   No facility-administered medications prior to visit.    ROS Review of Systems  Constitutional: Negative for fever and chills.  Eyes: Negative for visual disturbance.  Respiratory: Negative for shortness of breath.   Cardiovascular: Negative for chest pain.  Gastrointestinal: Negative for abdominal pain and blood in stool.  Musculoskeletal: Negative for back pain and arthralgias.  Skin: Negative for rash.  Allergic/Immunologic: Negative for immunocompromised state.  Hematological: Negative for adenopathy. Does not bruise/bleed easily.  Psychiatric/Behavioral: Negative for suicidal ideas and dysphoric mood.    Objective:  BP 101/65 mmHg  Pulse 75  Temp(Src) 98.2 F (36.8 C) (Oral)  Resp 16  Ht 5' 3.75" (1.619 m)  Wt 176 lb (79.833 kg)  BMI 30.46 kg/m2  SpO2 100%  LMP 02/24/2015  BP/Weight 03/09/2015 03/26/2014 02/22/2011  Systolic BP 101 99 123  Diastolic BP 65 67 71  Wt. (Lbs) 176 163.13 -  BMI 30.46 27.15 -   Physical Exam  Constitutional:  She is oriented to person, place, and time. She appears well-developed and well-nourished. No distress.  HENT:  Head: Normocephalic and atraumatic.  Cardiovascular: Normal rate, regular rhythm, normal heart sounds and intact distal pulses.   Pulmonary/Chest: Effort normal and breath sounds normal.  Musculoskeletal: She exhibits no edema.  Neurological: She is alert and oriented to person, place, and time.  Skin: Skin is warm and dry. No rash noted.     Psychiatric: She has a normal mood and affect.    Lab Results  Component Value Date   HGBA1C 5.3 03/09/2015    Assessment & Plan:  Shykeria was seen today for hair/scalp problem and establish care.  Diagnoses and all orders for this visit:  Healthcare maintenance -     POCT glycosylated hemoglobin (Hb A1C)  Thinning hair -     TSH -     CBC -     Vitamin D, 25-hydroxy -     Iron and TIBC -     Ferritin  Screening for HIV (human immunodeficiency virus) -     HIV antibody (with reflex)     No orders of the defined types were placed in this encounter.    Follow-up: No Follow-up on file.   Dessa Phi MD

## 2015-03-10 ENCOUNTER — Encounter: Payer: Self-pay | Admitting: Clinical

## 2015-03-10 DIAGNOSIS — E559 Vitamin D deficiency, unspecified: Secondary | ICD-10-CM | POA: Insufficient documentation

## 2015-03-10 DIAGNOSIS — D509 Iron deficiency anemia, unspecified: Secondary | ICD-10-CM | POA: Insufficient documentation

## 2015-03-10 LAB — VITAMIN D 25 HYDROXY (VIT D DEFICIENCY, FRACTURES): Vit D, 25-Hydroxy: 15 ng/mL — ABNORMAL LOW (ref 30–100)

## 2015-03-10 MED ORDER — VITAMIN D (ERGOCALCIFEROL) 1.25 MG (50000 UNIT) PO CAPS: 50000.0000 [IU] | ORAL_CAPSULE | ORAL | Status: AC

## 2015-03-10 MED ORDER — FERROUS SULFATE 325 (65 FE) MG PO TABS: 325.0000 mg | ORAL_TABLET | Freq: Every day | ORAL | Status: AC

## 2015-03-10 NOTE — Progress Notes (Signed)
Depression screen [facility] 2/9 03/09/2015  Decreased Interest 0  Down, Depressed, Hopeless 0  PHQ - 2 Score 0  Altered sleeping 0  Tired, decreased energy 0  Change in appetite 0  Feeling bad or failure about yourself  0  Trouble concentrating 0  Moving slowly or fidgety/restless 0  Suicidal thoughts 0  PHQ-9 Score 0    GAD 7 : Generalized Anxiety Score 03/09/2015  Nervous, Anxious, on Edge 0  Control/stop worrying 0  Worry too much - different things 3  Trouble relaxing 1  Restless 0  Easily annoyed or irritable 1  Afraid - awful might happen 1  Total GAD 7 Score 6

## 2015-03-10 NOTE — Addendum Note (Signed)
Addended by: Dessa Phi on: 03/10/2015 08:53 AM   Modules accepted: Orders

## 2015-03-14 ENCOUNTER — Telehealth: Payer: Self-pay | Admitting: *Deleted

## 2015-03-14 NOTE — Telephone Encounter (Signed)
LVM to return call.

## 2015-03-14 NOTE — Telephone Encounter (Signed)
-----   Message from Dessa Phi, MD sent at 03/10/2015  8:51 AM EST ----- Vit D deficiency start vit D supplement, weekly  Slight anemia with borderline low iron, daily iron

## 2015-03-20 NOTE — Telephone Encounter (Signed)
Patient called requesting lab results, please f/u  °

## 2015-03-21 NOTE — Telephone Encounter (Signed)
Date of birth verified by pt  Lab results given  [facility]

## 2015-04-11 ENCOUNTER — Encounter: Payer: Self-pay | Admitting: Family Medicine

## 2015-04-11 ENCOUNTER — Ambulatory Visit: Payer: Medicaid Other | Attending: Family Medicine | Admitting: Family Medicine

## 2015-04-11 DIAGNOSIS — R21 Rash and other nonspecific skin eruption: Secondary | ICD-10-CM | POA: Diagnosis not present

## 2015-04-11 DIAGNOSIS — L659 Nonscarring hair loss, unspecified: Secondary | ICD-10-CM

## 2015-04-11 DIAGNOSIS — B354 Tinea corporis: Secondary | ICD-10-CM | POA: Diagnosis not present

## 2015-04-11 DIAGNOSIS — T7840XA Allergy, unspecified, initial encounter: Secondary | ICD-10-CM | POA: Diagnosis present

## 2015-04-11 DIAGNOSIS — X58XXXA Exposure to other specified factors, initial encounter: Secondary | ICD-10-CM | POA: Insufficient documentation

## 2015-04-11 MED ORDER — KETOCONAZOLE 2 % EX CREA
1.0000 | TOPICAL_CREAM | Freq: Two times a day (BID) | CUTANEOUS | Status: AC
Start: 2015-04-11 — End: ?

## 2015-04-11 MED ORDER — KETOCONAZOLE 2 % EX CREA: 1.0000 "application " | TOPICAL_CREAM | Freq: Every day | CUTANEOUS | Status: DC

## 2015-04-11 NOTE — Patient Instructions (Addendum)
Jessica Zamora was seen today for allergic reaction.  Diagnoses and all orders for this visit:  Pap smear for cervical cancer screening -     Cancel: Cytology - PAP  Thinning hair -     Ambulatory referral to Dermatology  Tinea corporis -     Discontinue: ketoconazole (NIZORAL) 2 % cream; Apply 1 application topically daily. -     ketoconazole (NIZORAL) 2 % cream; Apply 1 application topically 2 (two) times daily.   F/u in 4-6 weeks for pap smear  Dr. Armen Pickup   Body Ringworm Ringworm (tinea corporis) is a fungal infection of the skin on the body. This infection is not caused by worms, but is actually caused by a fungus. Fungus normally lives on the top of your skin and can be useful. However, in the case of ringworms, the fungus grows out of control and causes a skin infection. It can involve any area of skin on the body and can spread easily from one person to another (contagious). Ringworm is a common problem for children, but it can affect adults as well. Ringworm is also often found in athletes, especially wrestlers who share equipment and mats.  CAUSES  Ringworm of the body is caused by a fungus called dermatophyte. It can spread by:  Touchingother people who are infected.  Touchinginfected pets.  Touching or sharingobjects that have been in contact with the infected person or pet (hats, combs, towels, clothing, sports equipment). SYMPTOMS   Itchy, raised red spots and bumps on the skin.  Ring-shaped rash.  Redness near the border of the rash with a clear center.  Dry and scaly skin on or around the rash. Not every person develops a ring-shaped rash. Some develop only the red, scaly patches. DIAGNOSIS  Most often, ringworm can be diagnosed by performing a skin exam. Your caregiver may choose to take a skin scraping from the affected area. The sample will be examined under the microscope to see if the fungus is present.  TREATMENT  Body ringworm may be treated with a topical  antifungal cream or ointment. Sometimes, an antifungal shampoo that can be used on your body is prescribed. You may be prescribed antifungal medicines to take by mouth if your ringworm is severe, keeps coming back, or lasts a long time.  HOME CARE INSTRUCTIONS   Only take over-the-counter or prescription medicines as directed by your caregiver.  Wash the infected area and dry it completely before applying yourcream or ointment.  When using antifungal shampoo to treat the ringworm, leave the shampoo on the body for 3-5 minutes before rinsing.   Wear loose clothing to stop clothes from rubbing and irritating the rash.  Wash or change your bed sheets every night while you have the rash.  Have your pet treated by your veterinarian if it has the same infection. To prevent ringworm:   Practice good hygiene.  Wear sandals or shoes in public places and showers.  Do not share personal items with others.  Avoid touching red patches of skin on other people.  Avoid touching pets that have bald spots or wash your hands after doing so. SEEK MEDICAL CARE IF:   Your rash continues to spread after 7 days of treatment.  Your rash is not gone in 4 weeks.  The area around your rash becomes red, warm, tender, and swollen.   This information is not intended to replace advice given to you by your health care provider. Make sure you discuss any questions you have  with your health care provider.   Document Released: 12/29/1999 Document Revised: 09/25/2011 Document Reviewed: 07/15/2011 Elsevier Interactive Patient Education [employer]2016 [employer]

## 2015-04-11 NOTE — Assessment & Plan Note (Signed)
Tinea corporis vs eczema  nizoral cream BID

## 2015-04-11 NOTE — Assessment & Plan Note (Signed)
Thinning hair  Derm referral

## 2015-04-11 NOTE — Progress Notes (Signed)
Possible allergic reaction on neck  F/U loss of hair  No pain today  No tobacco user  No suicidal thoughts in the past two weeks

## 2015-04-11 NOTE — Progress Notes (Signed)
Subjective:  Patient ID: Jessica Zamora, female    DOB: 11/24/1985  Age: 30 y.o. MRN: [id number]  CC: Allergic Reaction   HPI Jessica Zamora presents for    1. Hair loss: x 4 years at temples. Painless. R >L. Tried biotin and minoxidil without improvement. Distressed by hair loss. Hair loss started while breastfeeding. She would like referral for additional treatment if available.  2. Rash: on R side of neck. X 2 weeks. Pruritic. No new skin products. Has treated with lotion only. Daughter has eczema.   3. Pap: she was scheduled for pap but unaware. Prefers to have pap done at later date.   Social History  Substance Use Topics  . Smoking status: Never Smoker   . Smokeless tobacco: Never Used  . Alcohol Use: No   Outpatient Prescriptions Prior to Visit  Medication Sig Dispense Refill  . ferrous sulfate 325 (65 FE) MG tablet Take 1 tablet (325 mg total) by mouth daily with breakfast. 30 tablet 3  . Multiple Vitamin (MULTIVITAMIN WITH MINERALS) TABS tablet Take 1 tablet by mouth daily. Reported on 03/09/2015    . Vitamin D, Ergocalciferol, (DRISDOL) 50000 units CAPS capsule Take 1 capsule (50,000 Units total) by mouth every 7 (seven) days. For 8 weeks 8 capsule 0   No facility-administered medications prior to visit.    ROS Review of Systems  Constitutional: Negative for fever and chills.  Eyes: Negative for visual disturbance.  Respiratory: Negative for shortness of breath.   Cardiovascular: Negative for chest pain.  Gastrointestinal: Negative for abdominal pain and blood in stool.  Musculoskeletal: Negative for back pain and arthralgias.  Skin: Negative for rash.  Allergic/Immunologic: Negative for immunocompromised state.  Hematological: Negative for adenopathy. Does not bruise/bleed easily.  Psychiatric/Behavioral: Negative for suicidal ideas and dysphoric mood.    Objective:  BP 104/66 mmHg  Pulse 79  Temp(Src) 98.1 F (36.7 C) (Oral)  Resp 16  Ht 5' 3.17" (1.605 m)   Wt 181 lb (82.101 kg)  BMI 31.87 kg/m2  SpO2 99%  LMP 03/29/2015  BP/Weight 04/11/2015 03/09/2015 03/26/2014  Systolic BP 104 101 99  Diastolic BP 66 65 67  Wt. (Lbs) 181 176 163.13  BMI 31.87 30.46 27.15   Physical Exam  Constitutional: She is oriented to person, place, and time. She appears well-developed and well-nourished. No distress.  HENT:  Head: Normocephalic and atraumatic.  Cardiovascular: Normal rate, regular rhythm, normal heart sounds and intact distal pulses.   Pulmonary/Chest: Effort normal and breath sounds normal.  Musculoskeletal: She exhibits no edema.  Neurological: She is alert and oriented to person, place, and time.  Skin: scaly patch of raised skin on R anterior/lateral neck measuring 2 x 3 cm      Psychiatric: She has a normal mood and affect.    Lab Results  Component Value Date   HGBA1C 5.3 03/09/2015    Assessment & Plan:  Jessica Zamora was seen today for allergic reaction.  Diagnoses and all orders for this visit:  Pap smear for cervical cancer screening -     Cancel: Cytology - PAP  Thinning hair -     Ambulatory referral to Dermatology  Tinea corporis -     Discontinue: ketoconazole (NIZORAL) 2 % cream; Apply 1 application topically daily. -     ketoconazole (NIZORAL) 2 % cream; Apply 1 application topically 2 (two) times daily.     No orders of the defined types were placed in this encounter.    Follow-up: No Follow-up  on file.   Boykin Nearing MD

## 2015-04-19 ENCOUNTER — Ambulatory Visit (INDEPENDENT_AMBULATORY_CARE_PROVIDER_SITE_OTHER): Payer: Medicaid Other | Admitting: Family Medicine

## 2015-04-19 VITALS — BP 118/56 | HR 71 | Temp 98.6°F | Wt 177.0 lb

## 2015-04-19 DIAGNOSIS — L659 Nonscarring hair loss, unspecified: Secondary | ICD-10-CM | POA: Diagnosis present

## 2015-04-19 NOTE — Progress Notes (Signed)
   Subjective:    Patient ID: Jessica Zamora, female    DOB: 11/06/1985, 30 y.o.   MRN: [id number]  HPI  30 year old female presents for evaluation of alopecia.  - reports symptoms began about 4 years ago after giving birth to her daughter - notes loss of hair mainly at her hairline  - no hair breaks, but reports she sees the hair follicle when her hair falls - no dandruff or itching of scalp  - denies pulling out hair - denies putting hair in tight bun or ponytail constantly  - has tried OTC topical minoxidil which improved symptoms but self discontinued due to fear of side effects; patient did not have side effects  - has also tried garlic, eggs, onion, baby shampoo, henna which have not helped - no family history of thinning hair in women, but father and grandfather have female pattern balding - has a history of anemia which she is taking supplements for; additionally taking Vitamin D - patient does wear hijab but denies wearing it tightly    Review of Systems     Objective:   Physical Exam Scalp: mild thinning of hair near hair line otherwise no significant alopecia noted. No dandruff or rash noted on scalp.      Assessment & Plan:  Thinning hair Symptoms suggestive of Telogen effluvium. Per chart review, normal TSH. Though history of anemia, patient is taking iron supplements. Considering if due to vitamin deficiency such as B6. Dicussed with patient about possible options to help improve symptoms including multivitamin and/or topical minoxidil. Patient declined topical minoxidil and decided to try multivitamin with B6.  - follow up PRN.

## 2015-04-19 NOTE — Assessment & Plan Note (Signed)
Symptoms suggestive of Telogen effluvium. Per chart review, normal TSH. Though history of anemia, patient is taking iron supplements. Considering if due to vitamin deficiency such as B6. Dicussed with patient about possible options to help improve symptoms including multivitamin and/or topical minoxidil. Patient declined topical minoxidil and decided to try multivitamin with B6.  - follow up PRN.

## 2015-04-19 NOTE — Patient Instructions (Signed)
You can get the following multivitamin or something similar to this to see if this helps with your hair thinning:  Nature Made Multivitamin For Her with Iron & Calcium Tablets    Telogen effluvium is a form of diffuse, nonscarring hair loss that presents as a transient or chronic loss of hair. Hair loss in telogen effluvium occurs as a result of an abnormal shift in follicular cycling that leads to the premature shedding of hair. A wide variety of endogenous and exogenous factors have been linked to the induction of telogen effluvium. Examples include major surgery, serious illness, childbirth, protein or caloric malnutrition, drugs, and severe emotional distress. In some cases, the inciting cause is unclear.

## 2016-01-22 IMAGING — CR DG CHEST 2V
2 series · 2 of 2 positions shown · non-contrast
Comparison: None.

CLINICAL DATA: Chest and abdominal pain. Emesis. Symptoms for 2
months after eating. Productive cough.

EXAM:
CHEST  2 VIEW

[chest pa]
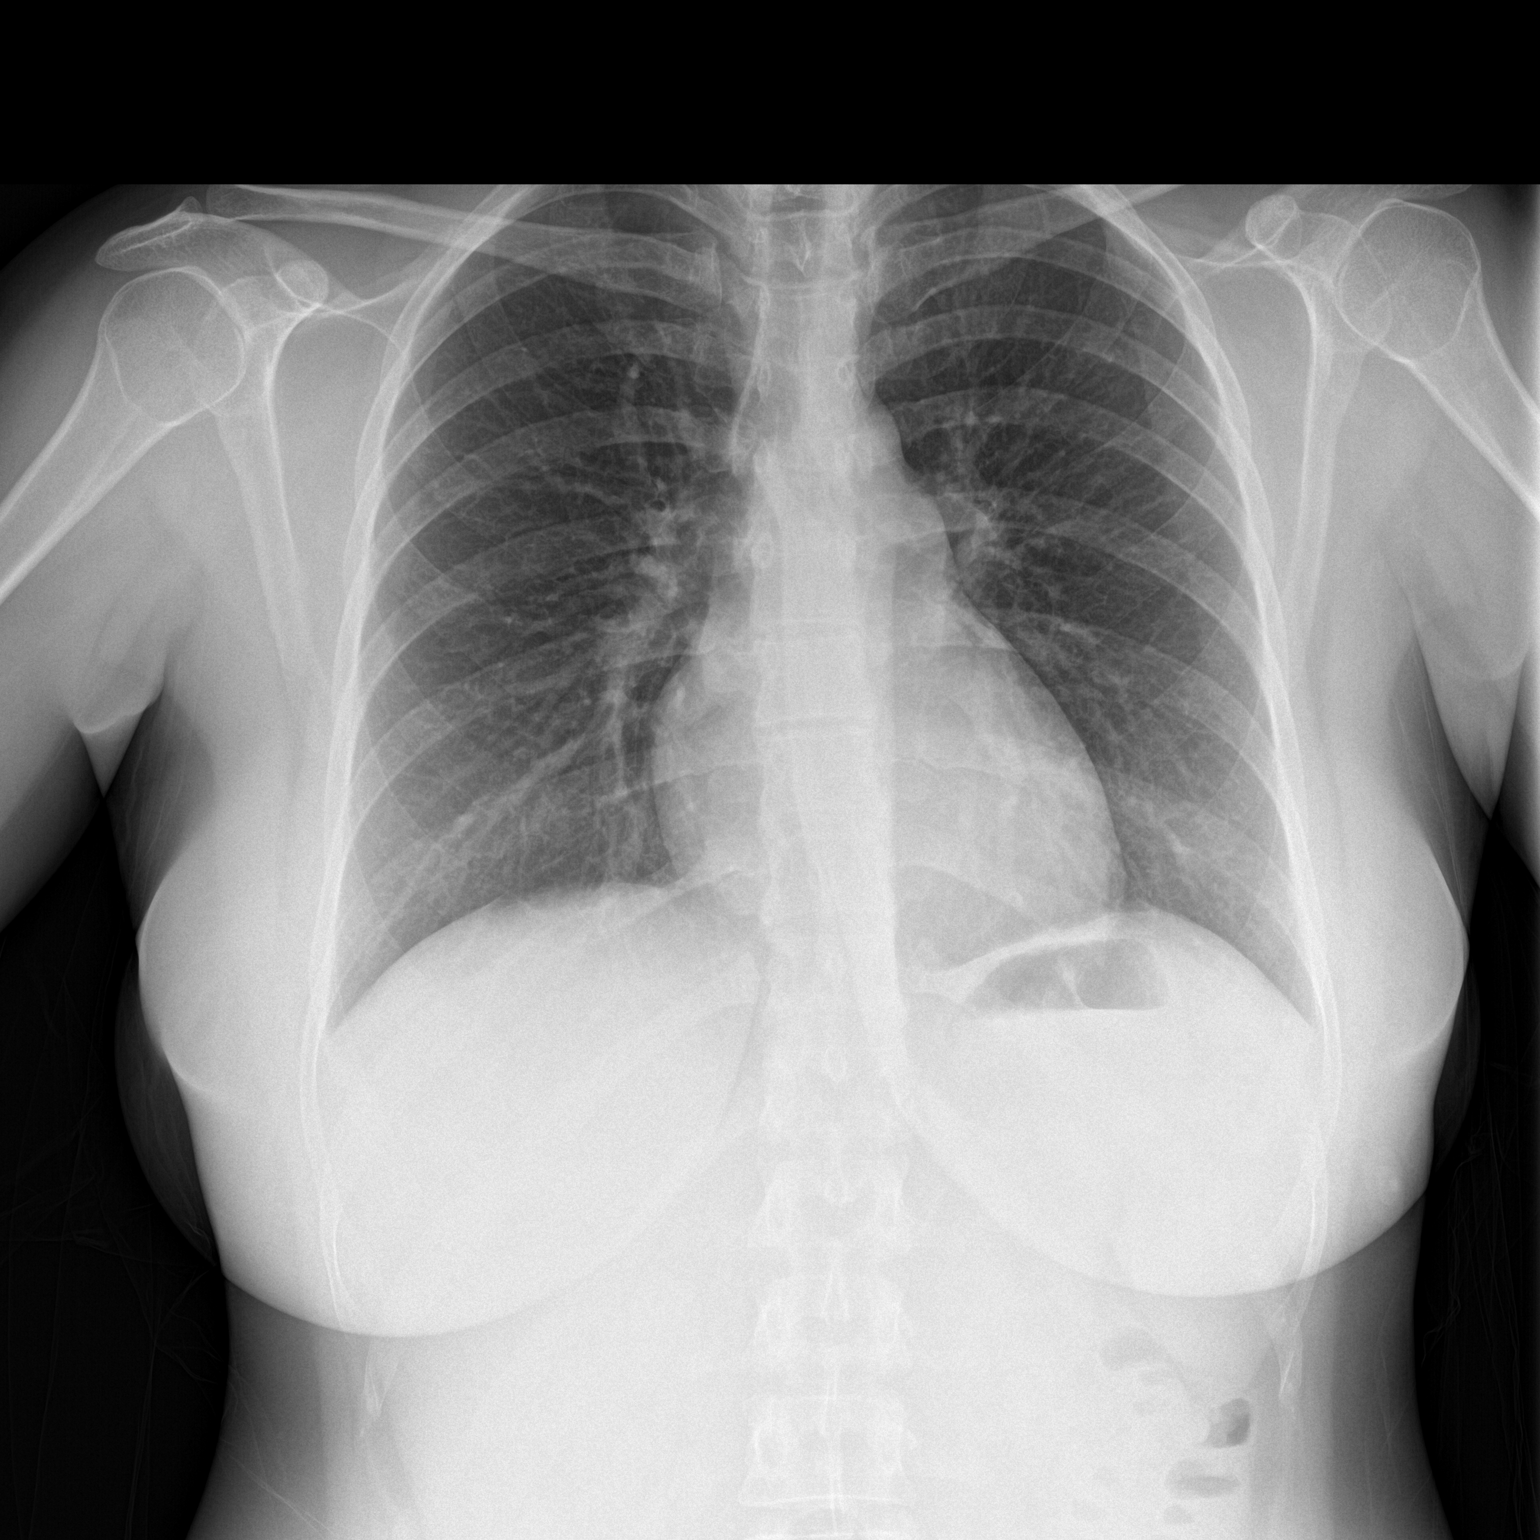

[chest lat]
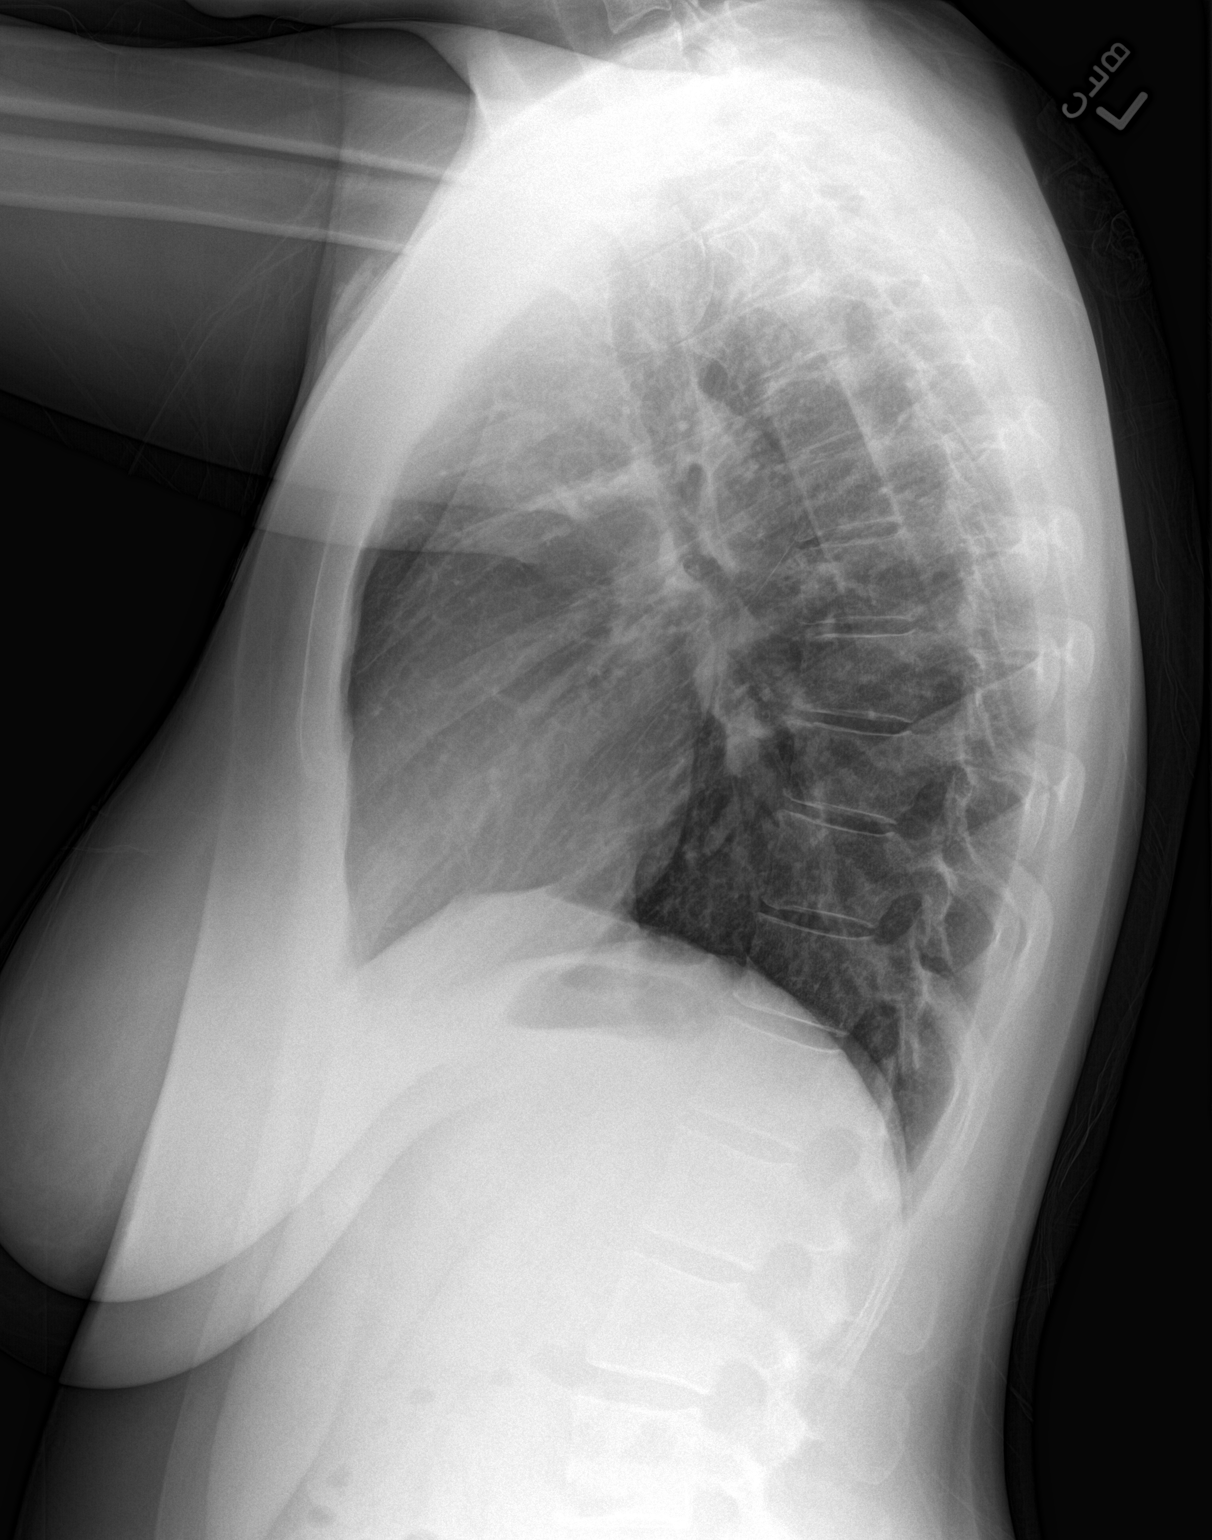

[2 of 2 positions shown; findings below may reference images not displayed]

FINDINGS: The cardiomediastinal contours are normal. The lungs are clear.
Pulmonary vasculature is normal. No consolidation, pleural effusion,
or pneumothorax. No acute osseous abnormalities are seen.
IMPRESSION: No acute pulmonary process.
